# Patient Record
Sex: Female | Born: 1955 | Race: White | Hispanic: No | Marital: Married | Smoking: Never smoker
Health system: Southern US, Community
[De-identification: ages and names within clinical notes are randomized; demographics above are authoritative.]

## PROBLEM LIST (undated history)

## (undated) DIAGNOSIS — J45909 Unspecified asthma, uncomplicated: Secondary | ICD-10-CM

## (undated) DIAGNOSIS — E119 Type 2 diabetes mellitus without complications: Secondary | ICD-10-CM

## (undated) DIAGNOSIS — I10 Essential (primary) hypertension: Secondary | ICD-10-CM

## (undated) DIAGNOSIS — E079 Disorder of thyroid, unspecified: Secondary | ICD-10-CM

---

## 2001-05-22 ENCOUNTER — Encounter: Payer: Self-pay | Admitting: Obstetrics and Gynecology

## 2001-05-22 ENCOUNTER — Encounter: Admission: RE | Admit: 2001-05-22 | Discharge: 2001-05-22 | Payer: Self-pay | Admitting: Obstetrics and Gynecology

## 2002-04-19 ENCOUNTER — Other Ambulatory Visit: Admission: RE | Admit: 2002-04-19 | Discharge: 2002-04-19 | Payer: Self-pay | Admitting: Obstetrics and Gynecology

## 2002-10-16 ENCOUNTER — Encounter: Admission: RE | Admit: 2002-10-16 | Discharge: 2003-01-14 | Payer: Self-pay | Admitting: Family Medicine

## 2003-05-08 ENCOUNTER — Other Ambulatory Visit: Admission: RE | Admit: 2003-05-08 | Discharge: 2003-05-08 | Payer: Self-pay | Admitting: Obstetrics and Gynecology

## 2003-08-05 ENCOUNTER — Ambulatory Visit (HOSPITAL_COMMUNITY): Admission: RE | Admit: 2003-08-05 | Discharge: 2003-08-05 | Payer: Self-pay | Admitting: Obstetrics and Gynecology

## 2003-08-05 ENCOUNTER — Encounter: Payer: Self-pay | Admitting: Obstetrics and Gynecology

## 2003-11-03 ENCOUNTER — Encounter: Admission: RE | Admit: 2003-11-03 | Discharge: 2003-11-03 | Payer: Self-pay | Admitting: Family Medicine

## 2005-06-24 ENCOUNTER — Other Ambulatory Visit: Admission: RE | Admit: 2005-06-24 | Discharge: 2005-06-24 | Payer: Self-pay | Admitting: Obstetrics and Gynecology

## 2008-08-08 ENCOUNTER — Ambulatory Visit (HOSPITAL_COMMUNITY): Admission: RE | Admit: 2008-08-08 | Discharge: 2008-08-08 | Payer: Self-pay | Admitting: Obstetrics and Gynecology

## 2008-08-08 ENCOUNTER — Encounter (INDEPENDENT_AMBULATORY_CARE_PROVIDER_SITE_OTHER): Payer: Self-pay | Admitting: Obstetrics and Gynecology

## 2011-04-19 NOTE — Op Note (Signed)
Alicia Mcmillan, Alicia Mcmillan                ACCOUNT NO.:  [id number]   MEDICAL RECORD NO.:  [id number]          PATIENT TYPE:  AMB   LOCATION:  SDC                           FACILITY:  [facility]   PHYSICIAN:  Zenaida Niece, M.D.DATE OF BIRTH:  [birth year]   DATE OF PROCEDURE:  08/08/2008  DATE OF DISCHARGE:                               OPERATIVE REPORT   PREOPERATIVE DIAGNOSES:  Postmenopausal bleeding and possible  endometrial polyp.   POSTOPERATIVE DIAGNOSES:  Postmenopausal bleeding and possible  endometrial polyp.   PROCEDURES:  Hysteroscopy with dilation and curettage and polypectomy.   SURGEON:  Zenaida Niece, MD   ANESTHESIA:  General with an LMA and paracervical block.   FINDINGS:  She had a normal endometrial cavity except for a small polyp  in the right cornu covering the right tubal ostia.   SPECIMENS:  Polyp and endometrial curettings sent to pathology.   ESTIMATED BLOOD LOSS:  Minimal.   COMPLICATIONS:  None.   PROCEDURE IN DETAIL:  The patient was taken to the operating room and  placed in the dorsal supine position.  General anesthesia was induced  and she was placed in mobile stirrups.  Perineum and vagina were then  prepped and draped in the usual sterile fashion and bladder drained with  a latex-free catheter.  A Graves speculum was inserted into the vagina  and the anterior lip of the cervix was grasped with a single-tooth  tenaculum.  Paracervical block was then performed with a total of 16 mL  of 2% plain lidocaine.  Uterus then sounded to 8 cm.  The cervix was  gradually dilated to a size 19 dilator.  The observer hysteroscope was  inserted and good visualization was achieved.  The uterus appeared  somewhat arcuate in shape, but essentially normal.  Most of the  endometrium was atrophic.  The left tubal ostia was easily identified.  There was a small polyp covering the right tubal ostia.  The  hysteroscope was removed.  Sharp curettage was gently performed  with  minimal tissue.  Hysteroscope was reinserted and the polyp was still  present at the right cornu.  Grasping forceps were passed through the  hysteroscope.  The polyp was grasped and twisted off and sent as part of  the specimen.  No further endometrial lesions were noted.  The  hysteroscope was removed after all fluid was allowed to egress from the  uterus.  The single-tooth tenaculum was removed and  bleeding controlled with pressure.  The Graves speculum was then  removed.  The patient was taken down from stirrups.  She was taken to  the recovery room in stable condition after tolerating the procedure  well.  Counts were correct, and she had PAS hose on throughout the  procedure.      Zenaida Niece, M.D.  Electronically Signed     TDM/MEDQ  D:  08/08/2008  T:  08/08/2008  Job:  [id number]

## 2014-01-15 ENCOUNTER — Other Ambulatory Visit: Payer: Self-pay | Admitting: Obstetrics and Gynecology

## 2014-01-17 ENCOUNTER — Other Ambulatory Visit: Payer: Self-pay | Admitting: Obstetrics and Gynecology

## 2014-01-22 ENCOUNTER — Other Ambulatory Visit: Payer: Self-pay | Admitting: Obstetrics and Gynecology

## 2014-01-22 DIAGNOSIS — N6311 Unspecified lump in the right breast, upper outer quadrant: Secondary | ICD-10-CM

## 2014-02-07 ENCOUNTER — Ambulatory Visit
Admission: RE | Admit: 2014-02-07 | Discharge: 2014-02-07 | Disposition: A | Discharge status: Home / Self Care | Payer: 59 | Source: Ambulatory Visit | Attending: Obstetrics and Gynecology | Admitting: Obstetrics and Gynecology

## 2014-02-07 ENCOUNTER — Other Ambulatory Visit: Payer: Self-pay | Admitting: Obstetrics and Gynecology

## 2014-02-07 DIAGNOSIS — N6311 Unspecified lump in the right breast, upper outer quadrant: Secondary | ICD-10-CM

## 2016-07-19 ENCOUNTER — Other Ambulatory Visit: Payer: Self-pay | Admitting: Obstetrics and Gynecology

## 2016-07-19 DIAGNOSIS — R928 Other abnormal and inconclusive findings on diagnostic imaging of breast: Secondary | ICD-10-CM

## 2016-08-05 ENCOUNTER — Ambulatory Visit
Admission: RE | Admit: 2016-08-05 | Discharge: 2016-08-05 | Disposition: A | Discharge status: Home / Self Care | Payer: 59 | Source: Ambulatory Visit | Attending: Obstetrics and Gynecology | Admitting: Obstetrics and Gynecology

## 2016-08-05 DIAGNOSIS — R928 Other abnormal and inconclusive findings on diagnostic imaging of breast: Secondary | ICD-10-CM

## 2016-08-29 ENCOUNTER — Ambulatory Visit: Admission: RE | Admit: 2016-08-29 | Payer: 59 | Source: Ambulatory Visit

## 2016-08-29 ENCOUNTER — Ambulatory Visit
Admission: RE | Admit: 2016-08-29 | Discharge: 2016-08-29 | Disposition: A | Discharge status: Home / Self Care | Payer: 59 | Source: Ambulatory Visit | Attending: Obstetrics and Gynecology | Admitting: Obstetrics and Gynecology

## 2016-08-29 DIAGNOSIS — R928 Other abnormal and inconclusive findings on diagnostic imaging of breast: Secondary | ICD-10-CM

## 2016-11-03 ENCOUNTER — Ambulatory Visit: Payer: 59

## 2016-11-10 ENCOUNTER — Ambulatory Visit: Payer: 59

## 2016-11-17 ENCOUNTER — Ambulatory Visit: Payer: 59

## 2021-01-25 ENCOUNTER — Emergency Department (HOSPITAL_COMMUNITY): Payer: 59

## 2021-01-25 ENCOUNTER — Encounter (HOSPITAL_COMMUNITY): Payer: Self-pay

## 2021-01-25 ENCOUNTER — Other Ambulatory Visit: Payer: Self-pay

## 2021-01-25 ENCOUNTER — Emergency Department (HOSPITAL_COMMUNITY)
Admission: EM | Admit: 2021-01-25 | Discharge: 2021-01-25 | Disposition: A | Discharge status: Home / Self Care | Payer: 59 | Attending: Emergency Medicine | Admitting: Emergency Medicine

## 2021-01-25 DIAGNOSIS — S8002XA Contusion of left knee, initial encounter: Secondary | ICD-10-CM | POA: Diagnosis not present

## 2021-01-25 DIAGNOSIS — Y92481 Parking lot as the place of occurrence of the external cause: Secondary | ICD-10-CM | POA: Diagnosis not present

## 2021-01-25 DIAGNOSIS — I10 Essential (primary) hypertension: Secondary | ICD-10-CM | POA: Diagnosis not present

## 2021-01-25 DIAGNOSIS — E119 Type 2 diabetes mellitus without complications: Secondary | ICD-10-CM | POA: Diagnosis not present

## 2021-01-25 DIAGNOSIS — S8001XA Contusion of right knee, initial encounter: Secondary | ICD-10-CM | POA: Insufficient documentation

## 2021-01-25 DIAGNOSIS — J45909 Unspecified asthma, uncomplicated: Secondary | ICD-10-CM | POA: Diagnosis not present

## 2021-01-25 DIAGNOSIS — W01198A Fall on same level from slipping, tripping and stumbling with subsequent striking against other object, initial encounter: Secondary | ICD-10-CM | POA: Diagnosis not present

## 2021-01-25 DIAGNOSIS — S42255A Nondisplaced fracture of greater tuberosity of left humerus, initial encounter for closed fracture: Secondary | ICD-10-CM | POA: Diagnosis not present

## 2021-01-25 DIAGNOSIS — S0083XA Contusion of other part of head, initial encounter: Secondary | ICD-10-CM | POA: Insufficient documentation

## 2021-01-25 DIAGNOSIS — S4992XA Unspecified injury of left shoulder and upper arm, initial encounter: Secondary | ICD-10-CM | POA: Diagnosis present

## 2021-01-25 DIAGNOSIS — S42295A Other nondisplaced fracture of upper end of left humerus, initial encounter for closed fracture: Secondary | ICD-10-CM | POA: Diagnosis not present

## 2021-01-25 HISTORY — DX: Essential (primary) hypertension: I10

## 2021-01-25 HISTORY — DX: Disorder of thyroid, unspecified: E07.9

## 2021-01-25 HISTORY — DX: Unspecified asthma, uncomplicated: J45.909

## 2021-01-25 HISTORY — DX: Type 2 diabetes mellitus without complications: E11.9

## 2021-01-25 MED ORDER — FENTANYL CITRATE (PF) 100 MCG/2ML IJ SOLN
100.0000 ug | Freq: Once | INTRAMUSCULAR | Status: AC
Start: 2021-01-25 — End: 2021-01-25
  Administered 2021-01-25: 100 ug via INTRAVENOUS
  Filled 2021-01-25: qty 2

## 2021-01-25 MED ORDER — FENTANYL CITRATE (PF) 100 MCG/2ML IJ SOLN
50.0000 ug | Freq: Once | INTRAMUSCULAR | Status: AC
  Administered 2021-01-25: 50 ug via INTRAVENOUS
  Filled 2021-01-25: qty 2

## 2021-01-25 MED ORDER — OXYCODONE HCL 5 MG PO TABS: 5.0000 mg | ORAL_TABLET | ORAL | 0 refills | Status: AC | PRN

## 2021-01-25 NOTE — Discharge Instructions (Addendum)
You may take up to 1000 mg of acetaminophen up to 4 times a day for 1 week. This is the maximum dose of Tylenol you can take from all sources. Please check other over-the-counter medications and prescriptions to ensure you are not taking other medications that contain acetaminophen.  You may also take ibuprofen 400 mg 6 times a day alternating with or at the same time as tylenol.  Take oxycodone as needed for breakthrough pain.  This medication can be addicting, sedating and cause constipation.

## 2021-01-25 NOTE — ED Triage Notes (Signed)
Pt tripped and fell today, fell forward and c/o left shoulder pain. Pt is A&O X 4, shoulder is immobilized, given fentanyl by EMS.

## 2021-01-25 NOTE — ED Provider Notes (Signed)
[facility]-EMERGENCY DEPT Provider Note   CSN: [id number] Arrival date & time: 01/25/21  1737     History Chief Complaint  Patient presents with  . Shoulder Pain    Alicia Mcmillan is a 65 y.o. female.  HPI      65yo female with history of asthma, htn, thyroid disease presents with concern for left shoulder pain after a fall.   Reports she tripped over a concrete parking stop and fell forward landing on her face and left shoulde. Notes severe pain to left shoulder, mild pain to left face.   Fell onto knees but does not have significant pain. Was able to ambulate following the fall.  No LOC, no headache, no visual changes, no n/v/cp/dyspnea/abd pain/numbnes or weakness.   Past Medical History:  Diagnosis Date  . Asthma   . Diabetes mellitus without complication (HCC)   . Hypertension   . Thyroid disease     There are no problems to display for this patient.   History reviewed. No pertinent surgical history.   OB History   No obstetric history on file.     No family history on file.  Social History   Tobacco Use  . Smoking status: Never Smoker  . Smokeless tobacco: Never Used  Substance Use Topics  . Alcohol use: Not Currently  . Drug use: Never    Home Medications Prior to Admission medications   Medication Sig Start Date End Date Taking? Authorizing Provider  oxyCODONE (ROXICODONE) 5 MG immediate release tablet Take 1 tablet (5 mg total) by mouth every 4 (four) hours as needed for severe pain. 01/25/21  Yes Alvira Monday, MD    Allergies    Penicillins  Review of Systems   Review of Systems  Constitutional: Negative for fever.  HENT: Positive for facial swelling. Negative for sore throat.   Eyes: Negative for visual disturbance.  Respiratory: Negative for cough and shortness of breath.   Cardiovascular: Negative for chest pain.  Gastrointestinal: Negative for abdominal pain, diarrhea, nausea and vomiting.  Genitourinary:  Negative for difficulty urinating.  Musculoskeletal: Positive for arthralgias. Negative for back pain and neck pain.  Skin: Negative for rash. Wound: abrasion bilateral knees.  Neurological: Positive for headaches. Negative for syncope.    Physical Exam Updated Vital Signs BP (!) 153/90   Pulse (!) 111   Temp 98.1 F (36.7 C) (Oral)   Resp 16   Ht 5' (1.524 m)   Wt 113.9 kg   SpO2 91%   BMI 49.02 kg/m   Physical Exam Vitals and nursing note reviewed.  Constitutional:      General: She is not in acute distress.    Appearance: She is well-developed and well-nourished. She is not diaphoretic.  HENT:     Head: Normocephalic.     Comments: Tenderness left zygoma, contusion Eyes:     Extraocular Movements: EOM normal.     Conjunctiva/sclera: Conjunctivae normal.  Cardiovascular:     Rate and Rhythm: Normal rate and regular rhythm.     Pulses: Intact distal pulses.     Heart sounds: Normal heart sounds. No murmur heard. No friction rub. No gallop.   Pulmonary:     Effort: Pulmonary effort is normal. No respiratory distress.     Breath sounds: Normal breath sounds. No wheezing or rales.  Abdominal:     General: There is no distension.     Palpations: Abdomen is soft.     Tenderness: There is no abdominal tenderness.  There is no guarding.  Musculoskeletal:        General: No tenderness or edema.     Cervical back: Normal range of motion.     Comments: Tenderness left shoulder/upper arm No tenderness to elbow, wrist, forearm No C/T/Lspine tenderness Normal opponens/finger abduction/wrist extension, normal sensation and pulses Contusion/abrasion bilateral knees without tenderness and with full ROM  Skin:    General: Skin is warm and dry.     Findings: No erythema or rash.  Neurological:     Mental Status: She is alert and oriented to person, place, and time.     ED Results / Procedures / Treatments   Labs (all labs ordered are listed, but only abnormal results are  displayed) Labs Reviewed - No data to display  EKG None  Radiology CT Head Wo Contrast  Result Date: 01/25/2021 CLINICAL DATA:  65 year old female with trauma. EXAM: CT HEAD WITHOUT CONTRAST CT MAXILLOFACIAL WITHOUT CONTRAST CT CERVICAL SPINE WITHOUT CONTRAST TECHNIQUE: Multidetector CT imaging of the head, cervical spine, and maxillofacial structures were performed using the standard protocol without intravenous contrast. Multiplanar CT image reconstructions of the cervical spine and maxillofacial structures were also generated. COMPARISON:  None. FINDINGS: CT HEAD FINDINGS Brain: The ventricles and sulci appropriate size for patient's age. The gray-white matter discrimination is preserved. There is no acute intracranial hemorrhage. No mass effect midline shift no extra-axial fluid collection. Vascular: No hyperdense vessel or unexpected calcification. Skull: Normal. Negative for fracture or focal lesion. Other: None. CT MAXILLOFACIAL FINDINGS Osseous: No acute fracture.  No mandibular subluxation. Orbits: Negative. No traumatic or inflammatory finding. Sinuses: Clear. Soft tissues: Negative. CT CERVICAL SPINE FINDINGS Alignment: No acute subluxation. Skull base and vertebrae: No acute fracture. Soft tissues and spinal canal: No prevertebral fluid or swelling. No visible canal hematoma. Disc levels:  Multilevel degenerative changes and spurring. Upper chest: Negative. Other: None IMPRESSION: 1. No acute intracranial pathology. 2. No acute/traumatic cervical spine pathology. 3. No acute facial bone fractures. Electronically Signed   By: Elgie Collard M.D.   On: 01/25/2021 21:31   CT Cervical Spine Wo Contrast  Result Date: 01/25/2021 CLINICAL DATA:  65 year old female with trauma. EXAM: CT HEAD WITHOUT CONTRAST CT MAXILLOFACIAL WITHOUT CONTRAST CT CERVICAL SPINE WITHOUT CONTRAST TECHNIQUE: Multidetector CT imaging of the head, cervical spine, and maxillofacial structures were performed using the  standard protocol without intravenous contrast. Multiplanar CT image reconstructions of the cervical spine and maxillofacial structures were also generated. COMPARISON:  None. FINDINGS: CT HEAD FINDINGS Brain: The ventricles and sulci appropriate size for patient's age. The gray-white matter discrimination is preserved. There is no acute intracranial hemorrhage. No mass effect midline shift no extra-axial fluid collection. Vascular: No hyperdense vessel or unexpected calcification. Skull: Normal. Negative for fracture or focal lesion. Other: None. CT MAXILLOFACIAL FINDINGS Osseous: No acute fracture.  No mandibular subluxation. Orbits: Negative. No traumatic or inflammatory finding. Sinuses: Clear. Soft tissues: Negative. CT CERVICAL SPINE FINDINGS Alignment: No acute subluxation. Skull base and vertebrae: No acute fracture. Soft tissues and spinal canal: No prevertebral fluid or swelling. No visible canal hematoma. Disc levels:  Multilevel degenerative changes and spurring. Upper chest: Negative. Other: None IMPRESSION: 1. No acute intracranial pathology. 2. No acute/traumatic cervical spine pathology. 3. No acute facial bone fractures. Electronically Signed   By: Elgie Collard M.D.   On: 01/25/2021 21:31   DG Shoulder Left  Result Date: 01/25/2021 CLINICAL DATA:  Pain following fall EXAM: LEFT SHOULDER - 2+ VIEW COMPARISON:  None. FINDINGS: Frontal,  oblique, and Y scapular images were obtained. There is an obliquely oriented fracture of the proximal humerus immediately distal to the metaphysis. There is lateral displacement of the distal fracture fragment with respect proximal fragment. There is also a fracture of the greater tuberosity in near anatomic alignment. No dislocation. No appreciable joint space narrowing or erosion. IMPRESSION: Obliquely oriented fracture proximal humerus immediately distal to the metaphysis with lateral displacement distally. Avulsion type fracture greater tuberosity with  alignment near anatomic. No dislocation. No appreciable arthropathy. Electronically Signed   By: Bretta Bang III M.D.   On: 01/25/2021 19:38   CT Maxillofacial Wo Contrast  Result Date: 01/25/2021 CLINICAL DATA:  65 year old female with trauma. EXAM: CT HEAD WITHOUT CONTRAST CT MAXILLOFACIAL WITHOUT CONTRAST CT CERVICAL SPINE WITHOUT CONTRAST TECHNIQUE: Multidetector CT imaging of the head, cervical spine, and maxillofacial structures were performed using the standard protocol without intravenous contrast. Multiplanar CT image reconstructions of the cervical spine and maxillofacial structures were also generated. COMPARISON:  None. FINDINGS: CT HEAD FINDINGS Brain: The ventricles and sulci appropriate size for patient's age. The gray-white matter discrimination is preserved. There is no acute intracranial hemorrhage. No mass effect midline shift no extra-axial fluid collection. Vascular: No hyperdense vessel or unexpected calcification. Skull: Normal. Negative for fracture or focal lesion. Other: None. CT MAXILLOFACIAL FINDINGS Osseous: No acute fracture.  No mandibular subluxation. Orbits: Negative. No traumatic or inflammatory finding. Sinuses: Clear. Soft tissues: Negative. CT CERVICAL SPINE FINDINGS Alignment: No acute subluxation. Skull base and vertebrae: No acute fracture. Soft tissues and spinal canal: No prevertebral fluid or swelling. No visible canal hematoma. Disc levels:  Multilevel degenerative changes and spurring. Upper chest: Negative. Other: None IMPRESSION: 1. No acute intracranial pathology. 2. No acute/traumatic cervical spine pathology. 3. No acute facial bone fractures. Electronically Signed   By: Elgie Collard M.D.   On: 01/25/2021 21:31    Procedures Procedures   Medications Ordered in ED Medications  fentaNYL (SUBLIMAZE) injection 50 mcg (50 mcg Intravenous Given 01/25/21 1904)  fentaNYL (SUBLIMAZE) injection 100 mcg (100 mcg Intravenous Given 01/25/21 2236)    ED  Course  I have reviewed the triage vital signs and the nursing notes.  Pertinent labs & imaging results that were available during my care of the patient were reviewed by me and considered in my medical decision making (see chart for details).    MDM Rules/Calculators/A&P                          65yo female with history of asthma, htn, thyroid disease presents with concern for left shoulder pain after a fall.  CT head, cspine, face done given trauma, pain, distracting injury, show no evidence of ICH or fracture.  XR shoulder with humerus fx proximal huemerus with lateral displacement and avulsion type fracture of greater tuberosity. Closed fracture, NV intact.  Discussed with Dr. Ave Filter who will see her in follow up on Friday. Placed in sling. Given rx for oxycodone after review in [place] drug database and discussion of risks. Patient discharged in stable condition with understanding of reasons to return.    Final Clinical Impression(s) / ED Diagnoses Final diagnoses:  Other closed nondisplaced fracture of proximal end of left humerus, initial encounter  Closed nondisplaced fracture of greater tuberosity of left humerus, initial encounter    Rx / DC Orders ED Discharge Orders         Ordered    oxyCODONE (ROXICODONE) 5 MG immediate release tablet  Every 4 hours PRN        01/25/21 2219           Alvira MondaySchlossman, [place], MD 01/26/21 1049

## 2021-01-25 NOTE — ED Notes (Signed)
Placed patient on external female catheter

## 2021-01-25 NOTE — ED Notes (Signed)
ED Provider at bedside. 

## 2021-01-25 NOTE — ED Notes (Signed)
Pt is out of room at this time.

## 2022-01-13 DIAGNOSIS — Z1389 Encounter for screening for other disorder: Secondary | ICD-10-CM | POA: Diagnosis not present

## 2022-01-13 DIAGNOSIS — Z01419 Encounter for gynecological examination (general) (routine) without abnormal findings: Secondary | ICD-10-CM | POA: Diagnosis not present

## 2022-01-13 DIAGNOSIS — Z130 Encounter for screening for diseases of the blood and blood-forming organs and certain disorders involving the immune mechanism: Secondary | ICD-10-CM | POA: Diagnosis not present

## 2022-01-13 DIAGNOSIS — Z6841 Body Mass Index (BMI) 40.0 and over, adult: Secondary | ICD-10-CM | POA: Diagnosis not present

## 2022-01-13 DIAGNOSIS — Z1231 Encounter for screening mammogram for malignant neoplasm of breast: Secondary | ICD-10-CM | POA: Diagnosis not present

## 2022-01-19 DIAGNOSIS — E039 Hypothyroidism, unspecified: Secondary | ICD-10-CM | POA: Diagnosis not present

## 2022-03-31 DIAGNOSIS — E039 Hypothyroidism, unspecified: Secondary | ICD-10-CM | POA: Diagnosis not present

## 2022-10-11 DIAGNOSIS — J4520 Mild intermittent asthma, uncomplicated: Secondary | ICD-10-CM | POA: Diagnosis not present

## 2022-10-11 DIAGNOSIS — E039 Hypothyroidism, unspecified: Secondary | ICD-10-CM | POA: Diagnosis not present

## 2022-10-11 DIAGNOSIS — E7800 Pure hypercholesterolemia, unspecified: Secondary | ICD-10-CM | POA: Diagnosis not present

## 2022-10-11 DIAGNOSIS — Z6841 Body Mass Index (BMI) 40.0 and over, adult: Secondary | ICD-10-CM | POA: Diagnosis not present

## 2022-10-11 DIAGNOSIS — I10 Essential (primary) hypertension: Secondary | ICD-10-CM | POA: Diagnosis not present

## 2022-10-11 DIAGNOSIS — Z79899 Other long term (current) drug therapy: Secondary | ICD-10-CM | POA: Diagnosis not present

## 2022-10-11 DIAGNOSIS — N393 Stress incontinence (female) (male): Secondary | ICD-10-CM | POA: Diagnosis not present

## 2022-11-29 DIAGNOSIS — H524 Presbyopia: Secondary | ICD-10-CM | POA: Diagnosis not present

## 2022-11-29 DIAGNOSIS — H52209 Unspecified astigmatism, unspecified eye: Secondary | ICD-10-CM | POA: Diagnosis not present

## 2022-11-29 DIAGNOSIS — H5213 Myopia, bilateral: Secondary | ICD-10-CM | POA: Diagnosis not present

## 2023-02-02 IMAGING — CT CT HEAD W/O CM
3 series · 15 of 47 positions shown, 18 images · non-contrast
Comparison: None.

CLINICAL DATA: 64-year-old female with trauma.

EXAM:
CT HEAD WITHOUT CONTRAST
CT MAXILLOFACIAL WITHOUT CONTRAST
CT CERVICAL SPINE WITHOUT CONTRAST
TECHNIQUE: Multidetector CT imaging of the head, cervical spine, and
maxillofacial structures were performed using the standard protocol
without intravenous contrast. Multiplanar CT image reconstructions
of the cervical spine and maxillofacial structures were also
generated.

[Series 3: head wo · axial · 0.39mm/px · z∈[+1452,+1577]mm · 9 of 30 slices shown, 12 images]
[im 3/30  brain]
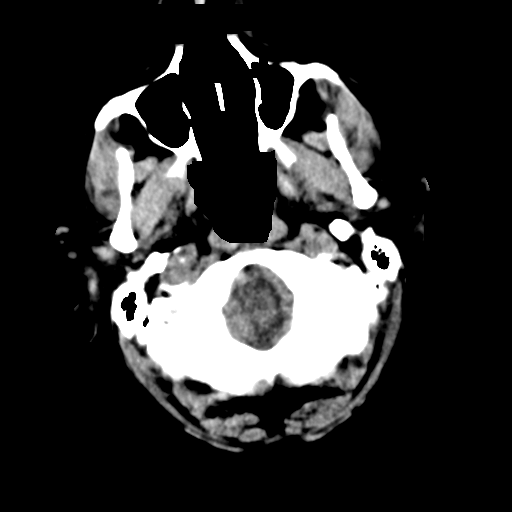
[im 3/30  bone]
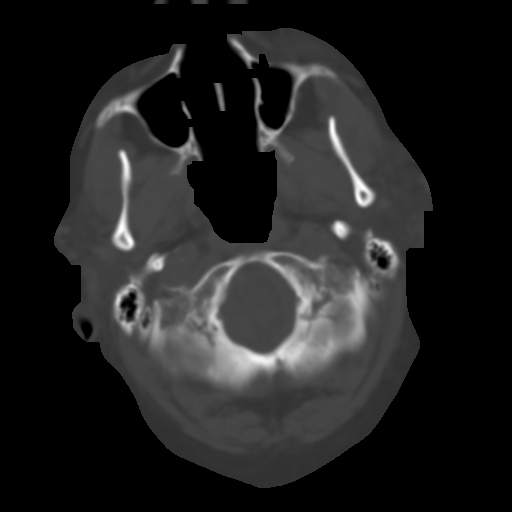
[im 6/30  brain]
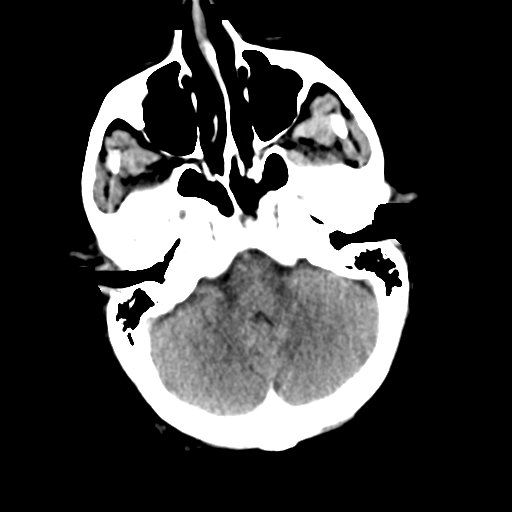
[im 9/30  brain]
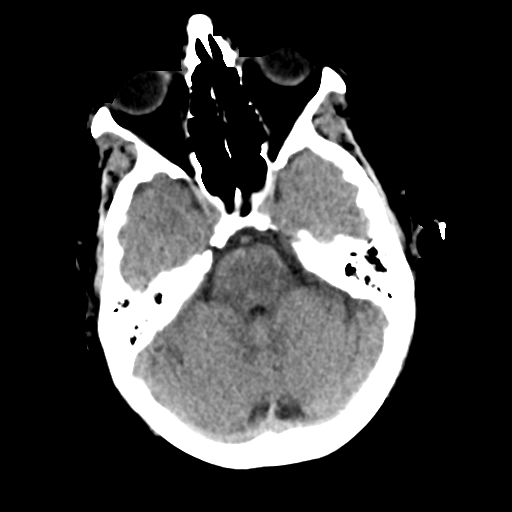
[im 12/30  brain]
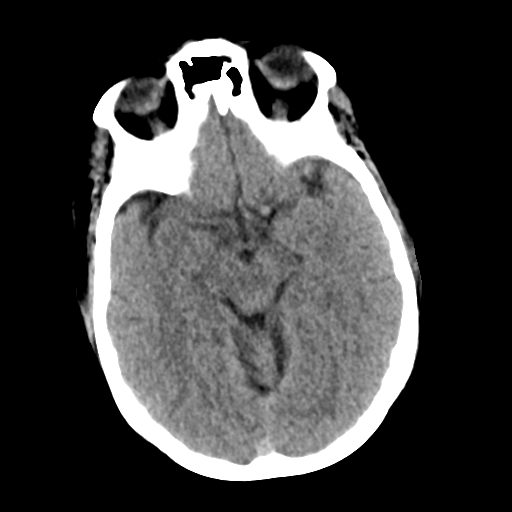
[im 16/30  brain]
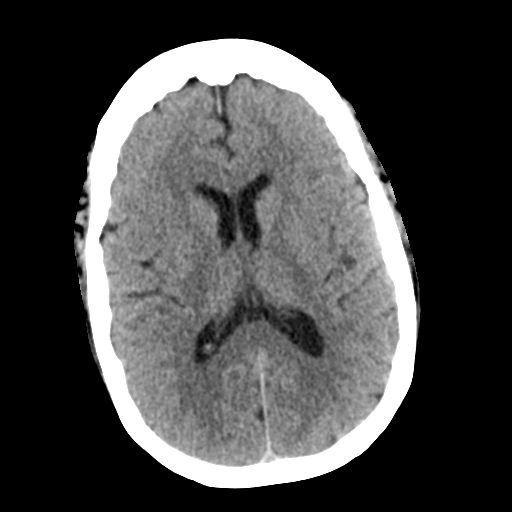
[im 16/30  bone]
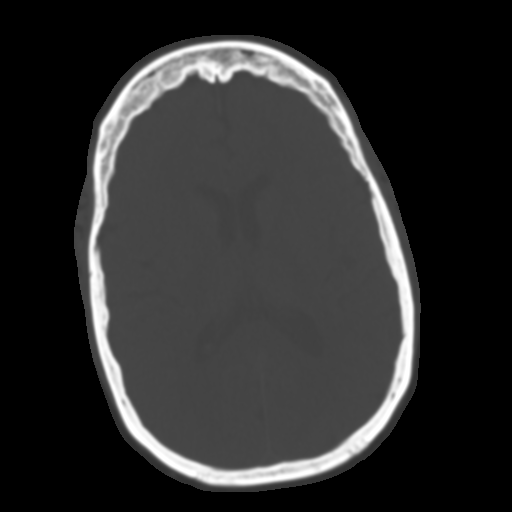
[im 19/30  brain]
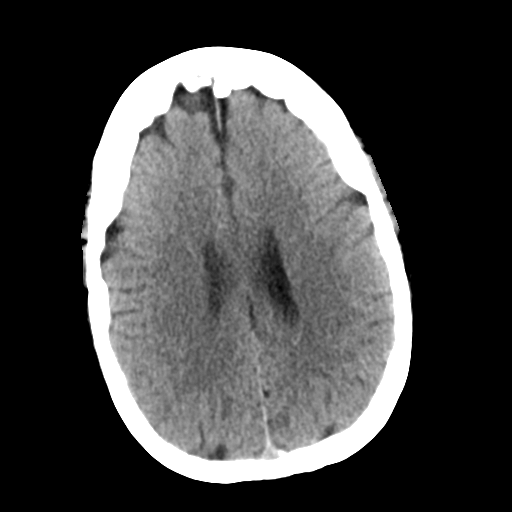
[im 22/30  brain]
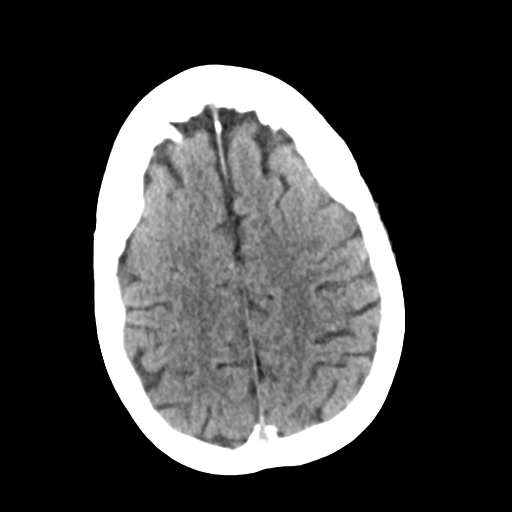
[im 25/30  brain]
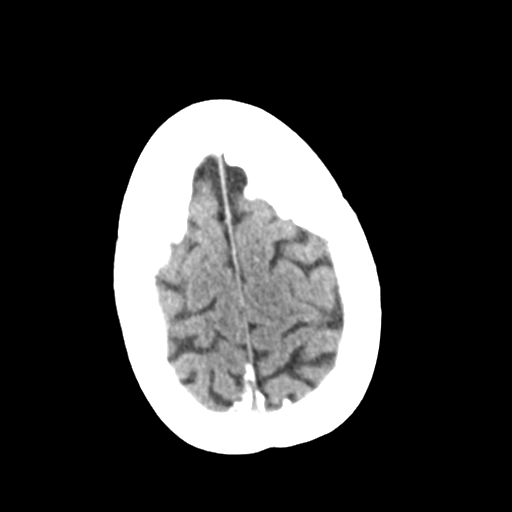
[im 28/30  brain]
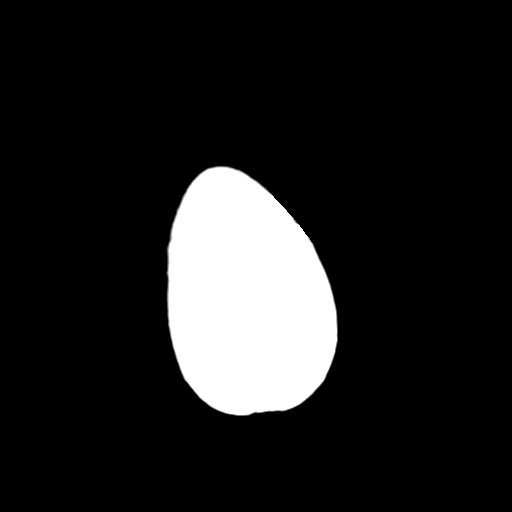
[im 28/30  bone]
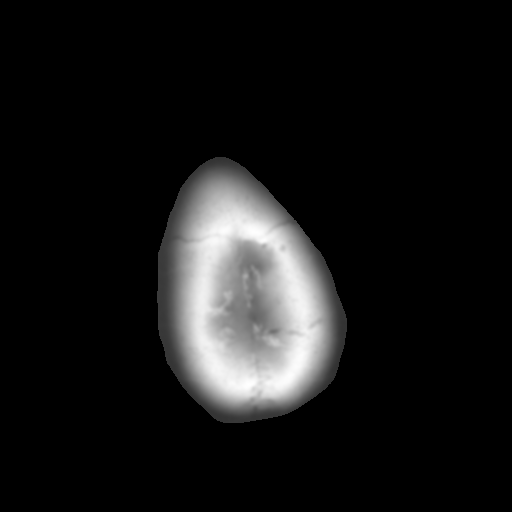

[Series 6: coronal soft tissue · coronal · 0.27mm/px · 3 of 64 slices shown]
[im 22/64  brain]
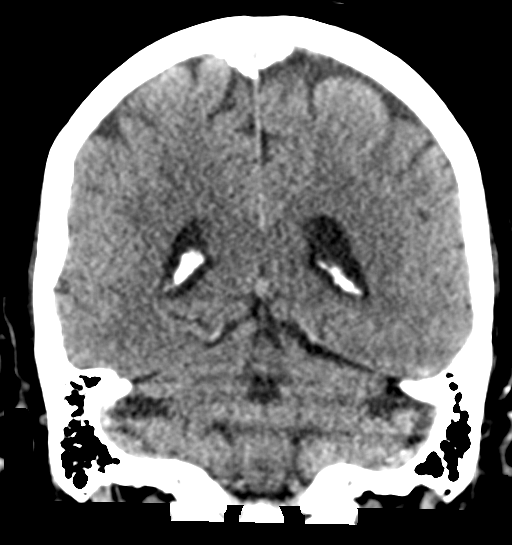
[im 29/64  brain]
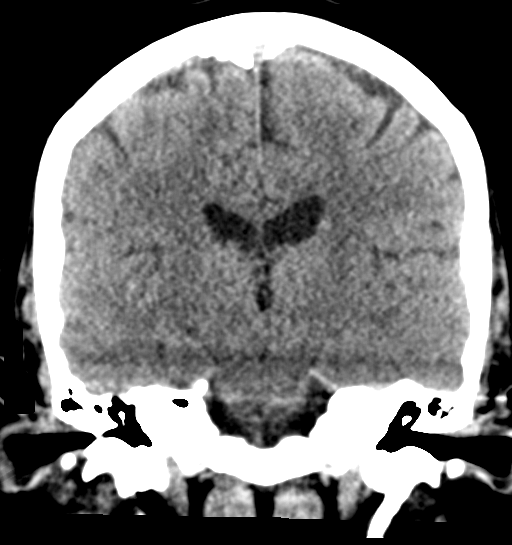
[im 36/64  brain]
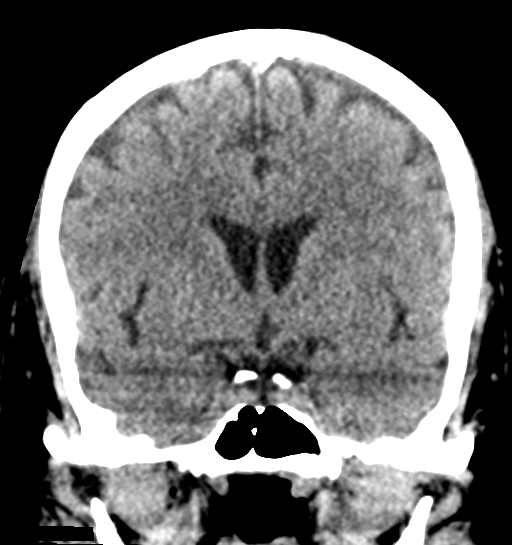

[Series 7: sagittal soft tissue · sagittal · 0.29mm/px · 3 of 46 slices shown]
[im 16/46  brain]
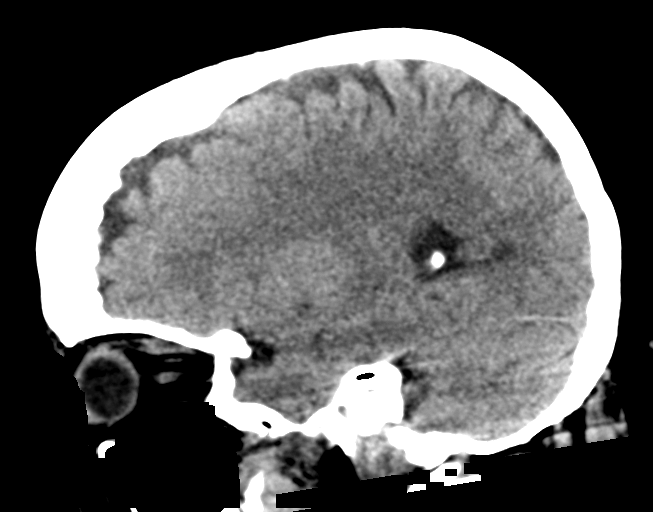
[im 23/46  brain]
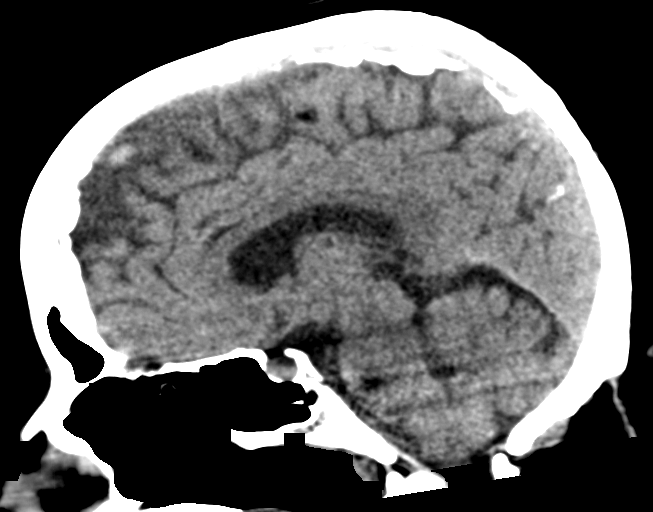
[im 31/46  brain]
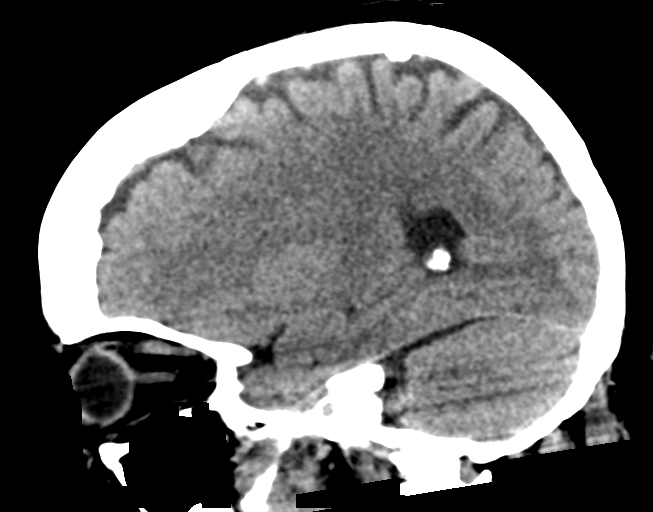

[15 of 47 positions shown; findings below may reference images not displayed]

FINDINGS: CT HEAD FINDINGS

Brain: The ventricles and sulci appropriate size for patient's age.
The gray-white matter discrimination is preserved. There is no acute
intracranial hemorrhage. No mass effect midline shift no extra-axial
fluid collection.

Vascular: No hyperdense vessel or unexpected calcification.

Skull: Normal. Negative for fracture or focal lesion.

Other: None.

CT MAXILLOFACIAL FINDINGS

Osseous: No acute fracture.  No mandibular subluxation.

Orbits: Negative. No traumatic or inflammatory finding.

Sinuses: Clear.

Soft tissues: Negative.

CT CERVICAL SPINE FINDINGS

Alignment: No acute subluxation.

Skull base and vertebrae: No acute fracture.

Soft tissues and spinal canal: No prevertebral fluid or swelling. No
visible canal hematoma.

Disc levels:  Multilevel degenerative changes and spurring.

Upper chest: Negative.

Other: None
IMPRESSION: 1. No acute intracranial pathology.
2. No acute/traumatic cervical spine pathology.
3. No acute facial bone fractures.

## 2023-02-02 IMAGING — CR DG SHOULDER 2+V*L*
3 series · 3 of 3 positions shown · non-contrast
Comparison: None.

CLINICAL DATA: Pain following fall

EXAM:
LEFT SHOULDER - 2+ VIEW

[x shoulder ap left (1 of 3)]
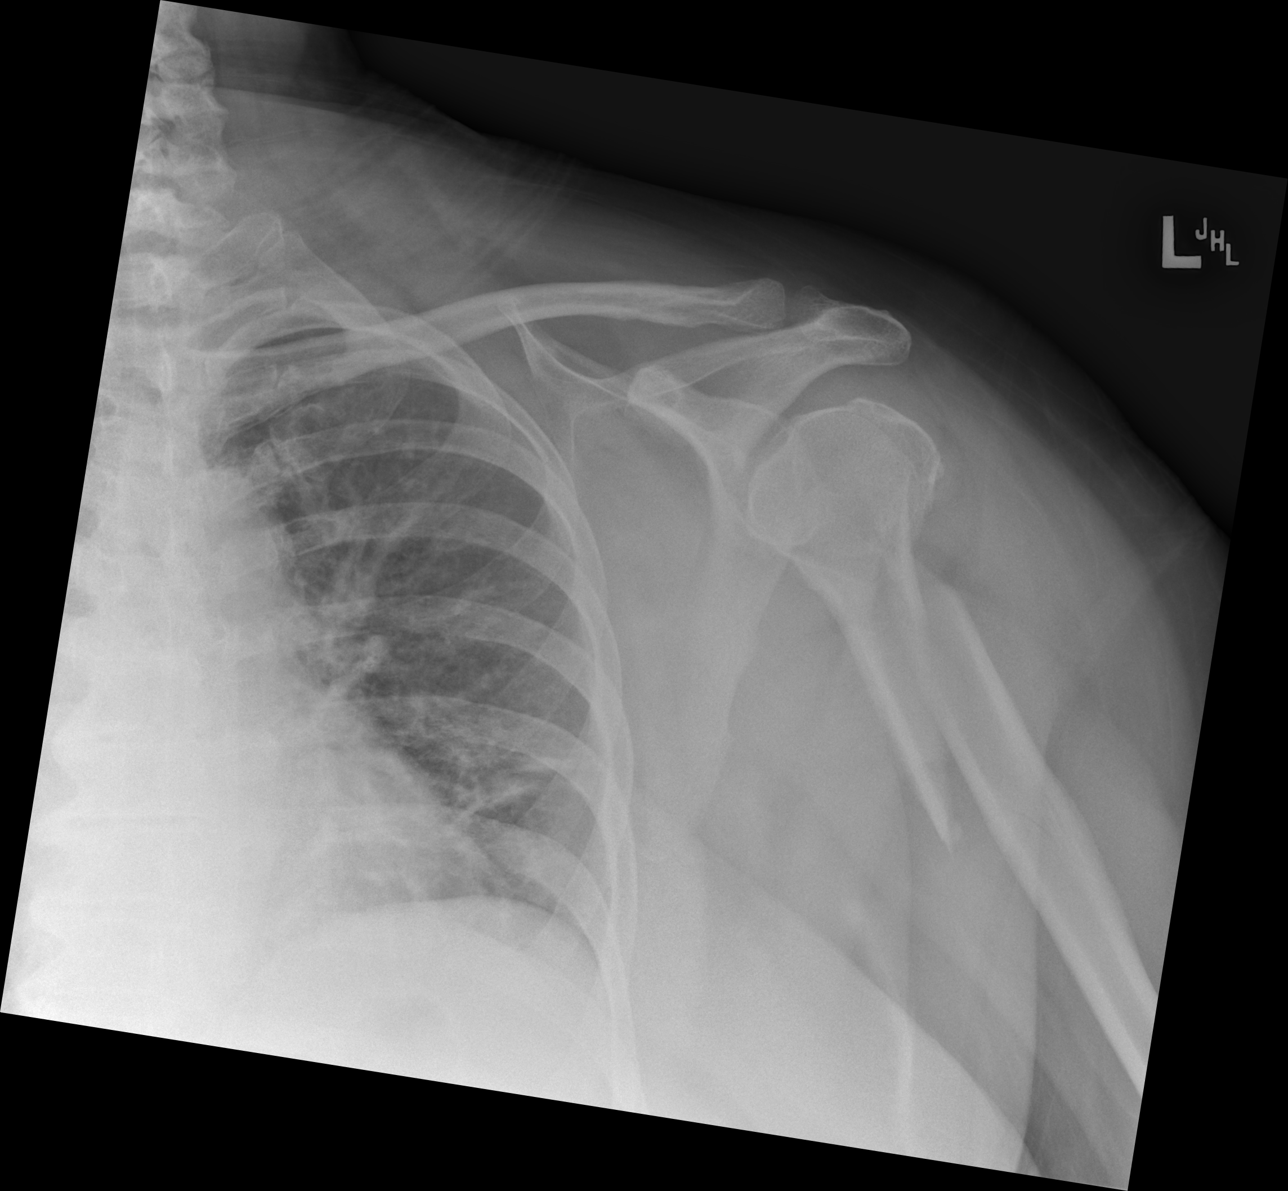

[x shoulder ap left (2 of 3)]
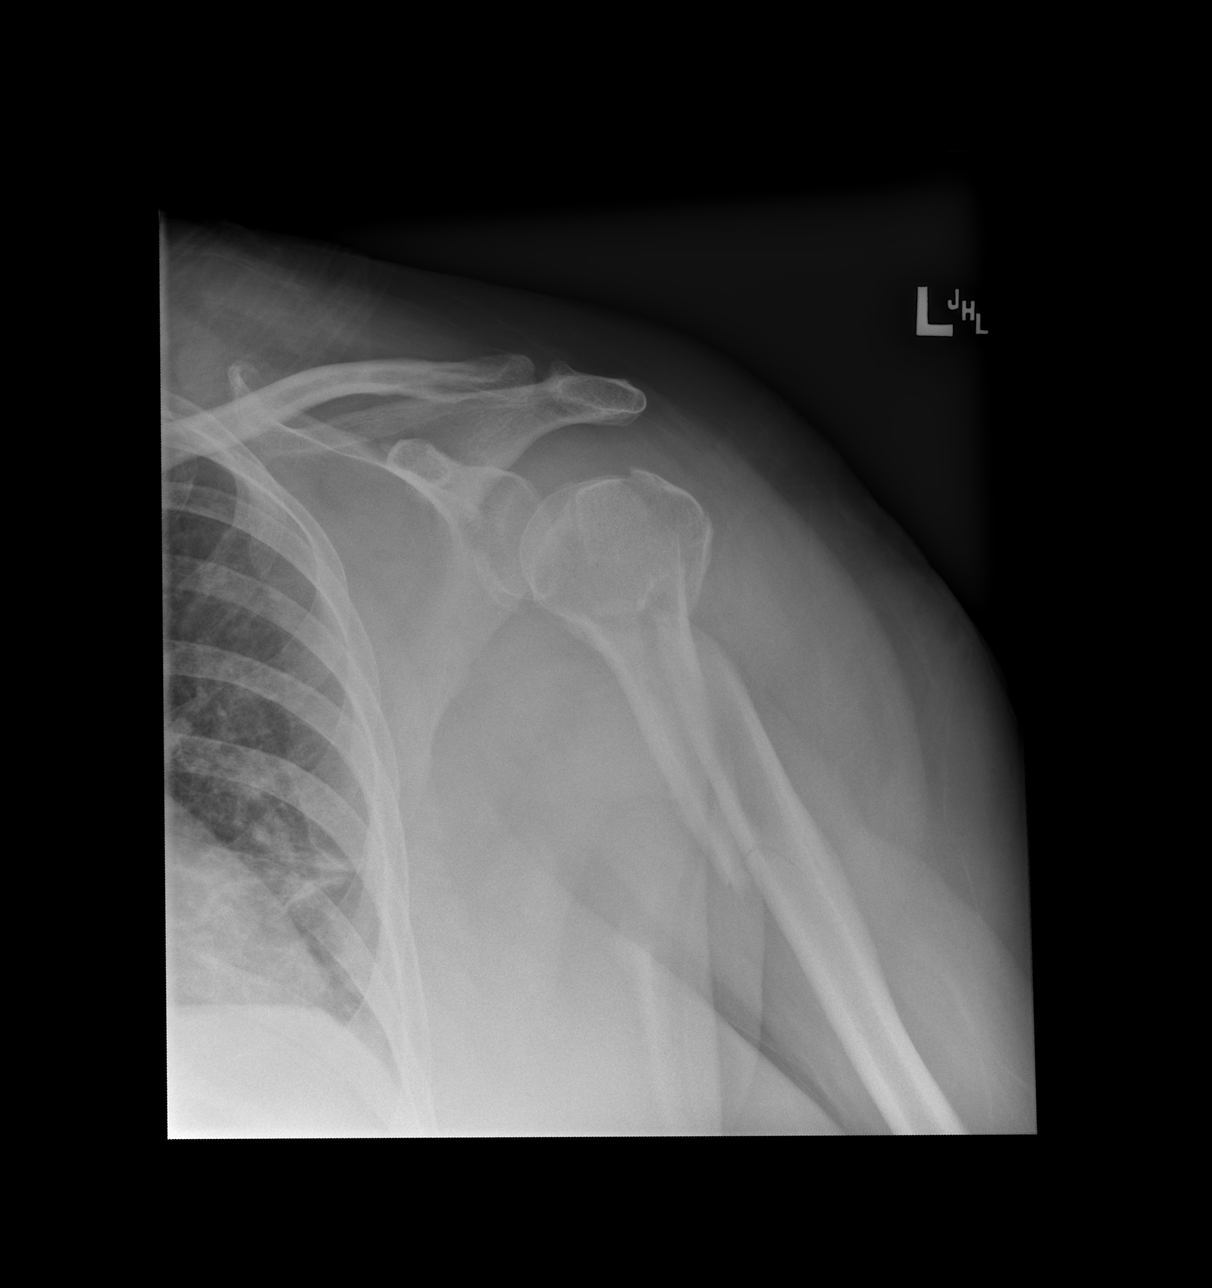

[x shoulder ap left (3 of 3)]
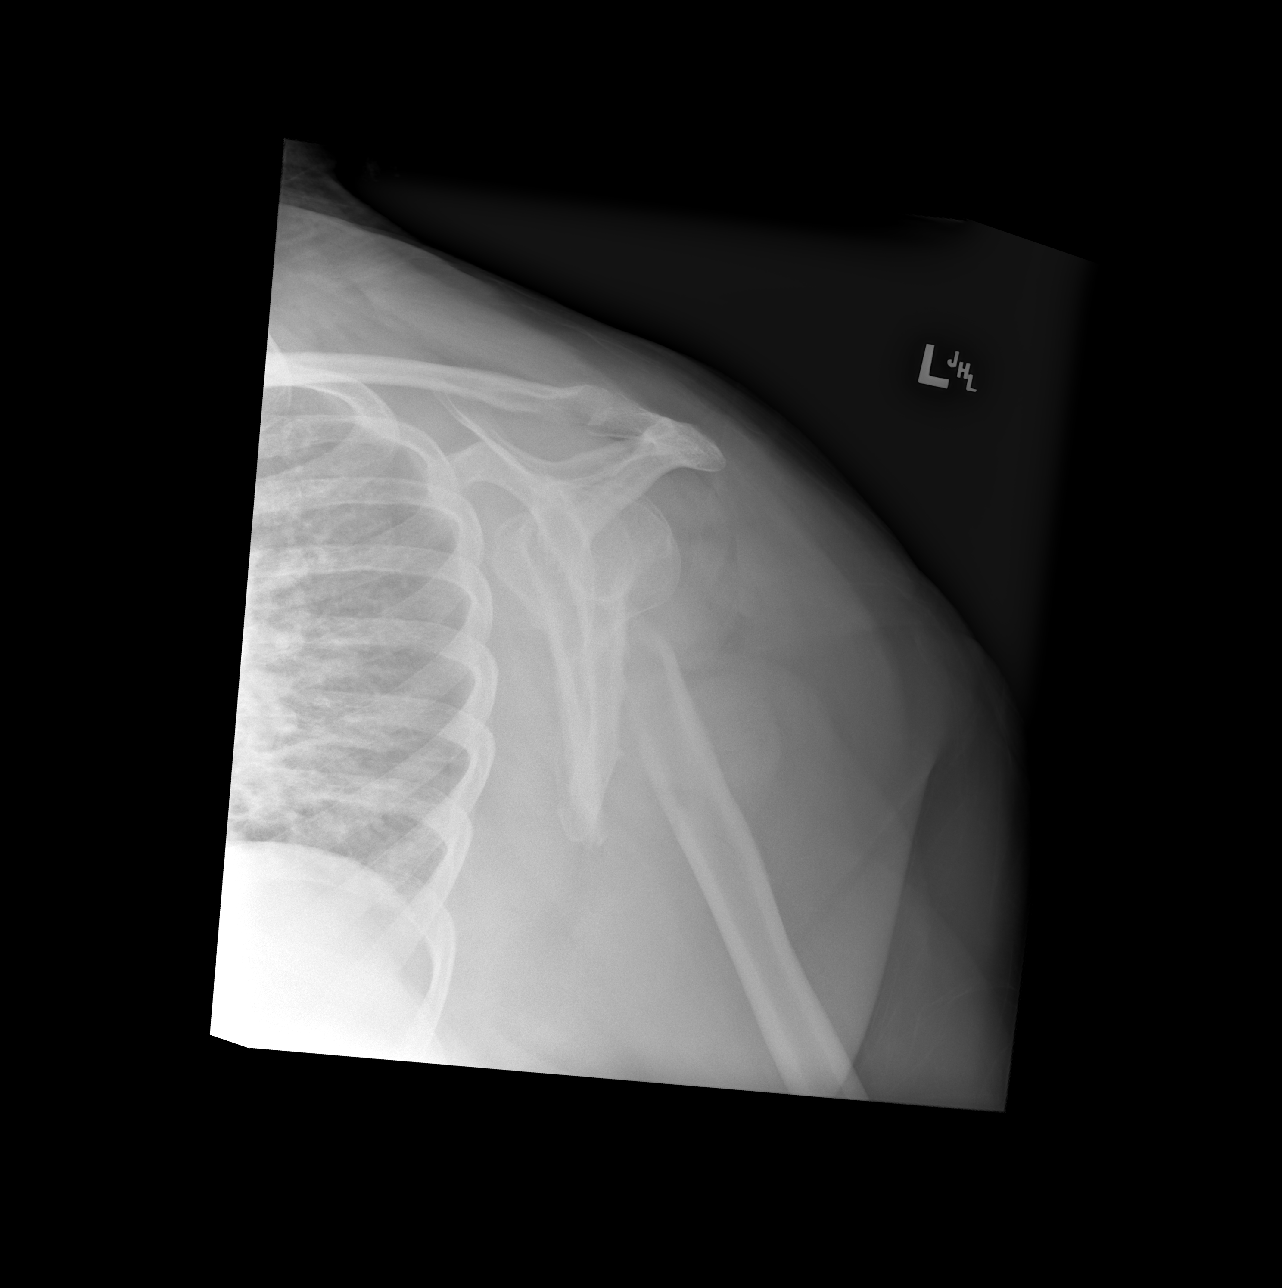

[3 of 3 positions shown; findings below may reference images not displayed]

FINDINGS: Frontal, oblique, and Y scapular images were obtained. There is an
obliquely oriented fracture of the proximal humerus immediately
distal to the metaphysis. There is lateral displacement of the
distal fracture fragment with respect proximal fragment. There is
also a fracture of the greater tuberosity in near anatomic
alignment. No dislocation. No appreciable joint space narrowing or
erosion.
IMPRESSION: Obliquely oriented fracture proximal humerus immediately distal to
the metaphysis with lateral displacement distally. Avulsion type
fracture greater tuberosity with alignment near anatomic. No
dislocation. No appreciable arthropathy.

## 2023-02-20 DIAGNOSIS — R051 Acute cough: Secondary | ICD-10-CM | POA: Diagnosis not present

## 2023-02-20 DIAGNOSIS — U071 COVID-19: Secondary | ICD-10-CM | POA: Diagnosis not present

## 2023-03-01 DIAGNOSIS — Z1231 Encounter for screening mammogram for malignant neoplasm of breast: Secondary | ICD-10-CM | POA: Diagnosis not present

## 2023-03-01 DIAGNOSIS — Z01419 Encounter for gynecological examination (general) (routine) without abnormal findings: Secondary | ICD-10-CM | POA: Diagnosis not present

## 2023-05-15 DIAGNOSIS — Z1239 Encounter for other screening for malignant neoplasm of breast: Secondary | ICD-10-CM | POA: Diagnosis not present

## 2023-05-15 DIAGNOSIS — E1165 Type 2 diabetes mellitus with hyperglycemia: Secondary | ICD-10-CM | POA: Diagnosis not present

## 2023-05-15 DIAGNOSIS — I10 Essential (primary) hypertension: Secondary | ICD-10-CM | POA: Diagnosis not present

## 2023-05-15 DIAGNOSIS — E7800 Pure hypercholesterolemia, unspecified: Secondary | ICD-10-CM | POA: Diagnosis not present

## 2023-05-15 DIAGNOSIS — Z0000 Encounter for general adult medical examination without abnormal findings: Secondary | ICD-10-CM | POA: Diagnosis not present

## 2023-05-15 DIAGNOSIS — Z23 Encounter for immunization: Secondary | ICD-10-CM | POA: Diagnosis not present

## 2023-05-15 DIAGNOSIS — Z1211 Encounter for screening for malignant neoplasm of colon: Secondary | ICD-10-CM | POA: Diagnosis not present

## 2023-05-15 DIAGNOSIS — Z6841 Body Mass Index (BMI) 40.0 and over, adult: Secondary | ICD-10-CM | POA: Diagnosis not present

## 2023-06-06 DIAGNOSIS — H2513 Age-related nuclear cataract, bilateral: Secondary | ICD-10-CM | POA: Diagnosis not present

## 2023-06-06 DIAGNOSIS — E119 Type 2 diabetes mellitus without complications: Secondary | ICD-10-CM | POA: Diagnosis not present

## 2023-06-06 DIAGNOSIS — H5213 Myopia, bilateral: Secondary | ICD-10-CM | POA: Diagnosis not present

## 2023-08-29 DIAGNOSIS — Z0100 Encounter for examination of eyes and vision without abnormal findings: Secondary | ICD-10-CM | POA: Diagnosis not present

## 2023-09-11 DIAGNOSIS — Z23 Encounter for immunization: Secondary | ICD-10-CM | POA: Diagnosis not present

## 2023-11-08 DIAGNOSIS — E039 Hypothyroidism, unspecified: Secondary | ICD-10-CM | POA: Diagnosis not present

## 2023-11-08 DIAGNOSIS — R300 Dysuria: Secondary | ICD-10-CM | POA: Diagnosis not present

## 2023-11-08 DIAGNOSIS — Z6841 Body Mass Index (BMI) 40.0 and over, adult: Secondary | ICD-10-CM | POA: Diagnosis not present

## 2023-11-08 DIAGNOSIS — E1165 Type 2 diabetes mellitus with hyperglycemia: Secondary | ICD-10-CM | POA: Diagnosis not present

## 2023-11-08 DIAGNOSIS — E7800 Pure hypercholesterolemia, unspecified: Secondary | ICD-10-CM | POA: Diagnosis not present

## 2023-11-08 DIAGNOSIS — I10 Essential (primary) hypertension: Secondary | ICD-10-CM | POA: Diagnosis not present

## 2024-06-11 DIAGNOSIS — H2513 Age-related nuclear cataract, bilateral: Secondary | ICD-10-CM | POA: Diagnosis not present

## 2024-06-11 DIAGNOSIS — H40013 Open angle with borderline findings, low risk, bilateral: Secondary | ICD-10-CM | POA: Diagnosis not present

## 2024-06-11 DIAGNOSIS — H5213 Myopia, bilateral: Secondary | ICD-10-CM | POA: Diagnosis not present

## 2024-06-11 DIAGNOSIS — H40053 Ocular hypertension, bilateral: Secondary | ICD-10-CM | POA: Diagnosis not present

## 2024-06-11 DIAGNOSIS — E119 Type 2 diabetes mellitus without complications: Secondary | ICD-10-CM | POA: Diagnosis not present
